# Patient Record
Sex: Female | Born: 1960 | Race: White | Hispanic: No | Smoking: Never smoker
Health system: Southern US, Community
[De-identification: ages and names within clinical notes are randomized; demographics above are authoritative.]

## PROBLEM LIST (undated history)

## (undated) DIAGNOSIS — N870 Mild cervical dysplasia: Secondary | ICD-10-CM

## (undated) DIAGNOSIS — B009 Herpesviral infection, unspecified: Secondary | ICD-10-CM

## (undated) DIAGNOSIS — E28319 Asymptomatic premature menopause: Secondary | ICD-10-CM

## (undated) HISTORY — DX: Asymptomatic premature menopause: E28.319

## (undated) HISTORY — PX: TUBAL LIGATION: SHX77

## (undated) HISTORY — DX: Herpesviral infection, unspecified: B00.9

## (undated) HISTORY — DX: Mild cervical dysplasia: N87.0

---
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
## 1992-04-24 HISTORY — PX: LAPAROSCOPY: SHX197

## 2000-04-24 DIAGNOSIS — N870 Mild cervical dysplasia: Secondary | ICD-10-CM

## 2000-04-24 HISTORY — DX: Mild cervical dysplasia: N87.0

## 2000-04-24 HISTORY — PX: CERVICAL BIOPSY  W/ LOOP ELECTRODE EXCISION: SUR135

## 2000-05-10 ENCOUNTER — Other Ambulatory Visit: Admission: RE | Admit: 2000-05-10 | Discharge: 2000-05-10 | Payer: Self-pay | Admitting: Gynecology

## 2000-05-29 ENCOUNTER — Encounter (INDEPENDENT_AMBULATORY_CARE_PROVIDER_SITE_OTHER): Payer: Self-pay | Admitting: Specialist

## 2000-05-29 ENCOUNTER — Other Ambulatory Visit: Admission: RE | Admit: 2000-05-29 | Discharge: 2000-05-29 | Payer: Self-pay | Admitting: Gynecology

## 2000-10-01 ENCOUNTER — Other Ambulatory Visit: Admission: RE | Admit: 2000-10-01 | Discharge: 2000-10-01 | Payer: Self-pay | Admitting: Gynecology

## 2002-01-07 ENCOUNTER — Other Ambulatory Visit: Admission: RE | Admit: 2002-01-07 | Discharge: 2002-01-07 | Payer: Self-pay | Admitting: Gynecology

## 2003-01-21 ENCOUNTER — Other Ambulatory Visit: Admission: RE | Admit: 2003-01-21 | Discharge: 2003-01-21 | Payer: Self-pay | Admitting: Gynecology

## 2004-01-27 ENCOUNTER — Other Ambulatory Visit: Admission: RE | Admit: 2004-01-27 | Discharge: 2004-01-27 | Payer: Self-pay | Admitting: Gynecology

## 2006-04-20 ENCOUNTER — Other Ambulatory Visit: Admission: RE | Admit: 2006-04-20 | Discharge: 2006-04-20 | Payer: Self-pay | Admitting: Gynecology

## 2006-09-06 ENCOUNTER — Other Ambulatory Visit: Admission: RE | Admit: 2006-09-06 | Discharge: 2006-09-06 | Payer: Self-pay | Admitting: Gynecology

## 2007-11-07 ENCOUNTER — Other Ambulatory Visit: Admission: RE | Admit: 2007-11-07 | Discharge: 2007-11-07 | Payer: Self-pay | Admitting: Gynecology

## 2008-11-12 ENCOUNTER — Ambulatory Visit: Payer: Self-pay | Admitting: Family Medicine

## 2008-11-12 DIAGNOSIS — R5381 Other malaise: Secondary | ICD-10-CM

## 2008-11-12 DIAGNOSIS — R5383 Other fatigue: Secondary | ICD-10-CM

## 2008-11-12 DIAGNOSIS — R109 Unspecified abdominal pain: Secondary | ICD-10-CM | POA: Insufficient documentation

## 2008-11-12 DIAGNOSIS — R197 Diarrhea, unspecified: Secondary | ICD-10-CM

## 2008-11-13 LAB — CONVERTED CEMR LAB
ALT: 16 units/L (ref 0–35)
AST: 21 units/L (ref 0–37)
Albumin: 4.3 g/dL (ref 3.5–5.2)
Basophils Absolute: 0 10*3/uL (ref 0.0–0.1)
Calcium: 9.1 mg/dL (ref 8.4–10.5)
GFR calc non Af Amer: 95.74 mL/min (ref >60)
HCT: 38 % (ref 36.0–46.0)
Hemoglobin: 13.1 g/dL (ref 12.0–15.0)
Lymphs Abs: 1 10*3/uL (ref 0.7–4.0)
MCV: 92.5 fL (ref 78.0–100.0)
Monocytes Absolute: 0.4 10*3/uL (ref 0.1–1.0)
Monocytes Relative: 10.3 % (ref 3.0–12.0)
Neutro Abs: 1.9 10*3/uL (ref 1.4–7.7)
Platelets: 215 10*3/uL (ref 150.0–400.0)
Potassium: 4.1 meq/L (ref 3.5–5.1)
RDW: 12.1 % (ref 11.5–14.6)
Sodium: 145 meq/L (ref 135–145)
TSH: 1.93 microintl units/mL (ref 0.35–5.50)
Total Bilirubin: 0.8 mg/dL (ref 0.3–1.2)

## 2008-12-07 ENCOUNTER — Ambulatory Visit: Payer: Self-pay | Admitting: Family Medicine

## 2008-12-08 LAB — CONVERTED CEMR LAB
Basophils Relative: 0.1 % (ref 0.0–3.0)
Eosinophils Relative: 2.3 % (ref 0.0–5.0)
Hemoglobin: 13 g/dL (ref 12.0–15.0)
Lymphocytes Relative: 36.1 % (ref 12.0–46.0)
Monocytes Relative: 10.9 % (ref 3.0–12.0)
Neutro Abs: 1.6 10*3/uL (ref 1.4–7.7)
Neutrophils Relative %: 50.6 % (ref 43.0–77.0)
RBC: 4.1 M/uL (ref 3.87–5.11)
WBC: 3.2 10*3/uL — ABNORMAL LOW (ref 4.5–10.5)

## 2008-12-11 ENCOUNTER — Other Ambulatory Visit: Admission: RE | Admit: 2008-12-11 | Discharge: 2008-12-11 | Payer: Self-pay | Admitting: Gynecology

## 2008-12-11 ENCOUNTER — Ambulatory Visit (HOSPITAL_COMMUNITY): Admission: RE | Admit: 2008-12-11 | Discharge: 2008-12-11 | Payer: Self-pay | Admitting: Gynecology

## 2008-12-11 ENCOUNTER — Ambulatory Visit: Payer: Self-pay | Admitting: Gynecology

## 2008-12-11 ENCOUNTER — Encounter: Payer: Self-pay | Admitting: Gynecology

## 2008-12-29 ENCOUNTER — Encounter: Payer: Self-pay | Admitting: Gynecology

## 2008-12-29 ENCOUNTER — Ambulatory Visit: Payer: Self-pay | Admitting: Gynecology

## 2009-01-12 ENCOUNTER — Ambulatory Visit: Payer: Self-pay | Admitting: Gynecology

## 2009-04-29 ENCOUNTER — Ambulatory Visit: Payer: Self-pay | Admitting: Gynecology

## 2009-07-13 ENCOUNTER — Other Ambulatory Visit: Admission: RE | Admit: 2009-07-13 | Discharge: 2009-07-13 | Payer: Self-pay | Admitting: Gynecology

## 2009-07-13 ENCOUNTER — Ambulatory Visit: Payer: Self-pay | Admitting: Gynecology

## 2009-11-23 IMAGING — CR DG CHEST 2V
2 series · 2 of 2 positions shown · non-contrast
Comparison: None

CLINICAL DATA: Family history of lung cancer.  No current chest
complaints.  Nonsmoker

CHEST - 2 VIEW

[view not recorded (1 of 2)]
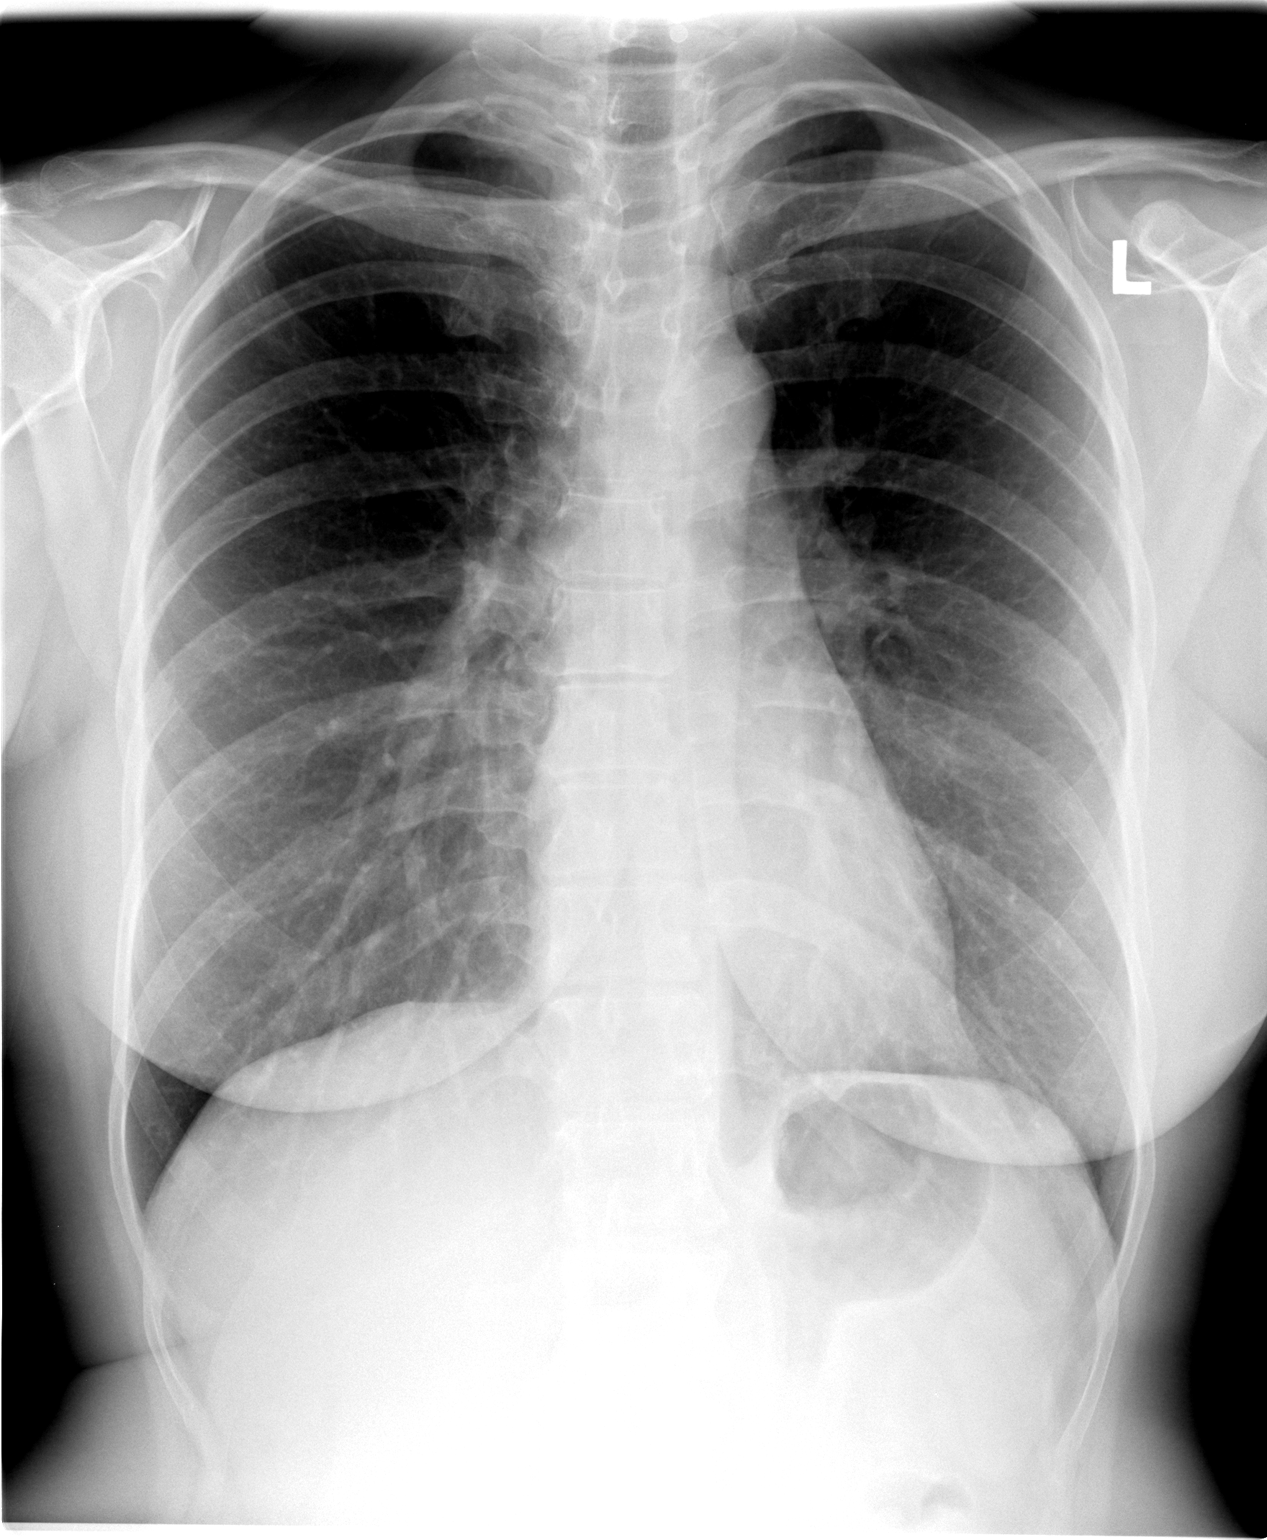

[view not recorded (2 of 2)]
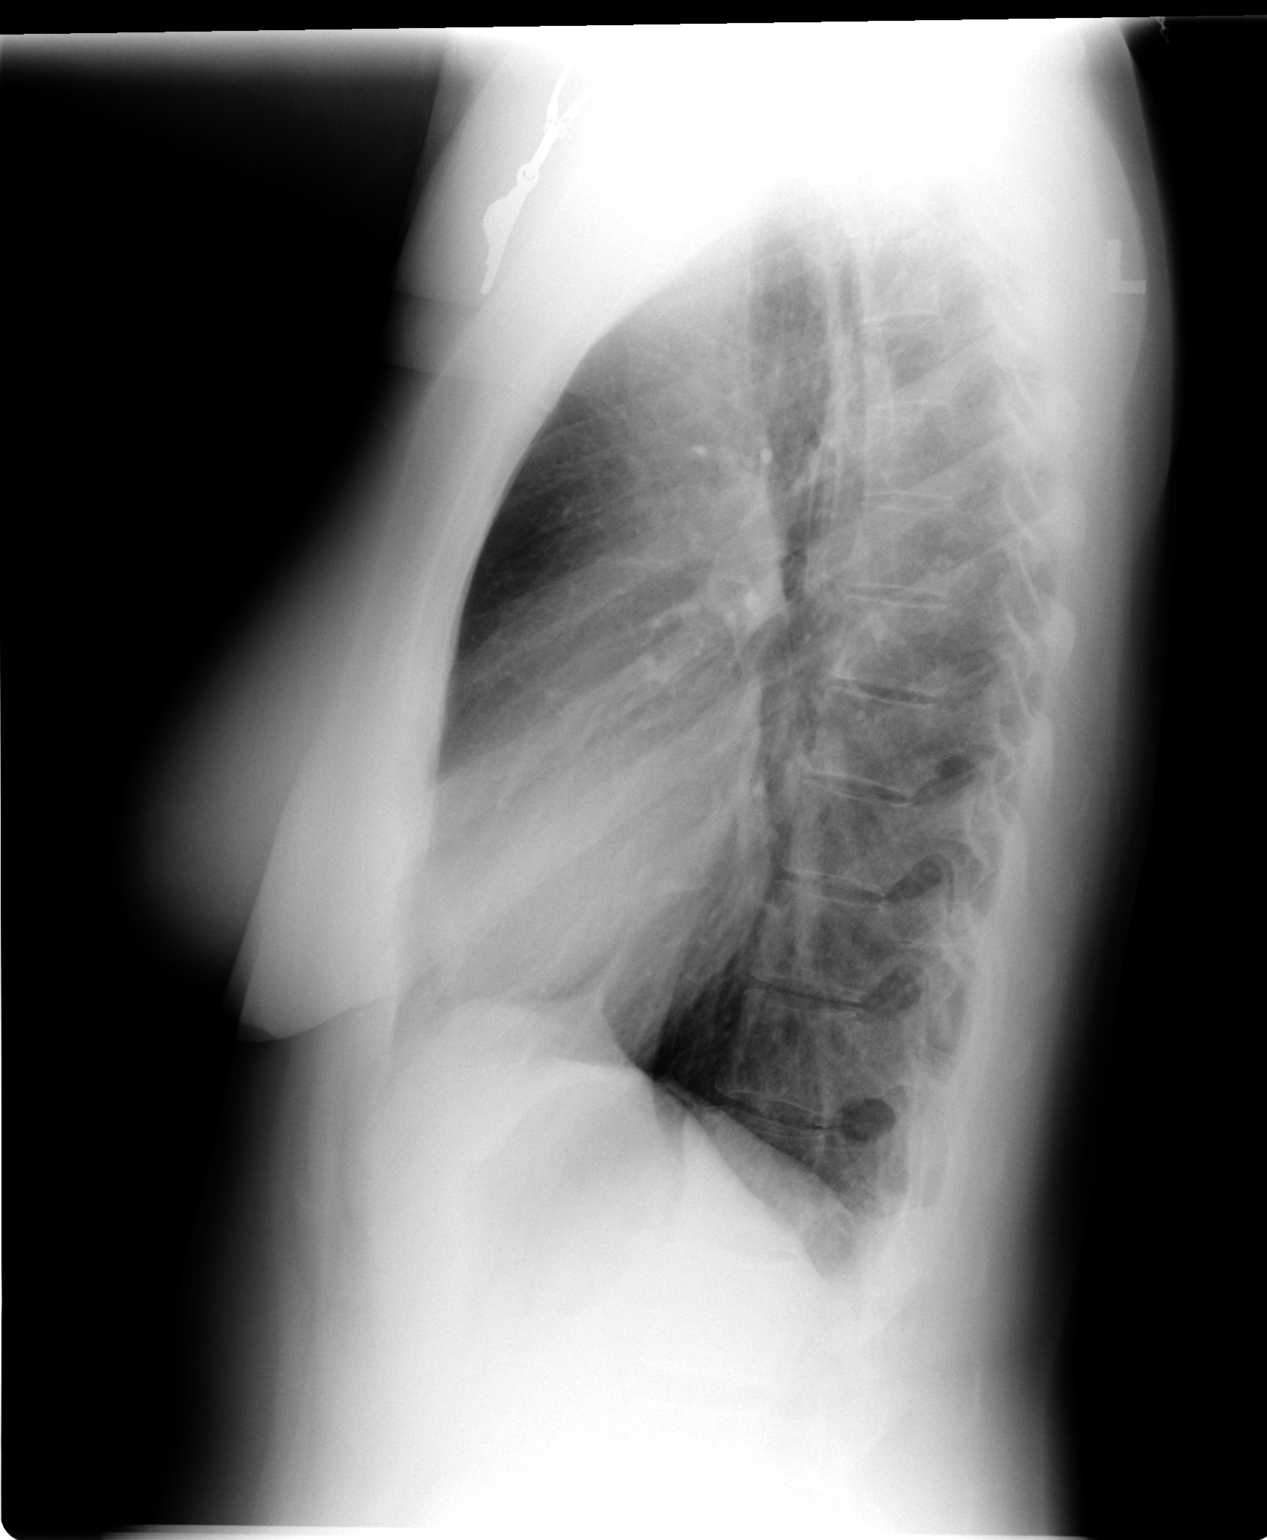

[2 of 2 positions shown; findings below may reference images not displayed]

FINDINGS: Heart and mediastinal contours are within normal limits.
The lung fields are clear with no evidence for focal infiltrate or
congestive failure.  Bony structures appear intact.
IMPRESSION: No acute cardiopulmonary disease noted

## 2009-12-14 ENCOUNTER — Other Ambulatory Visit: Admission: RE | Admit: 2009-12-14 | Discharge: 2009-12-14 | Payer: Self-pay | Admitting: Gynecology

## 2009-12-14 ENCOUNTER — Ambulatory Visit: Payer: Self-pay | Admitting: Gynecology

## 2010-04-25 ENCOUNTER — Ambulatory Visit: Payer: Self-pay | Admitting: Gynecology

## 2010-05-19 ENCOUNTER — Other Ambulatory Visit
Admission: RE | Admit: 2010-05-19 | Discharge: 2010-05-19 | Payer: Self-pay | Source: Home / Self Care | Admitting: Gynecology

## 2010-05-19 ENCOUNTER — Other Ambulatory Visit: Payer: Self-pay | Admitting: Gynecology

## 2011-01-10 DIAGNOSIS — N870 Mild cervical dysplasia: Secondary | ICD-10-CM | POA: Insufficient documentation

## 2011-01-10 DIAGNOSIS — B009 Herpesviral infection, unspecified: Secondary | ICD-10-CM | POA: Insufficient documentation

## 2011-01-10 DIAGNOSIS — E559 Vitamin D deficiency, unspecified: Secondary | ICD-10-CM | POA: Insufficient documentation

## 2011-01-10 DIAGNOSIS — E28319 Asymptomatic premature menopause: Secondary | ICD-10-CM | POA: Insufficient documentation

## 2011-01-11 ENCOUNTER — Encounter: Payer: Self-pay | Admitting: Gynecology

## 2011-01-19 ENCOUNTER — Other Ambulatory Visit: Payer: Self-pay | Admitting: Gynecology

## 2011-01-19 ENCOUNTER — Ambulatory Visit: Admission: RE | Admit: 2011-01-19 | Payer: BC Managed Care – PPO | Source: Ambulatory Visit

## 2011-01-19 DIAGNOSIS — Z13820 Encounter for screening for osteoporosis: Secondary | ICD-10-CM

## 2011-02-01 ENCOUNTER — Other Ambulatory Visit (HOSPITAL_COMMUNITY)
Admission: RE | Admit: 2011-02-01 | Discharge: 2011-02-01 | Disposition: A | Discharge status: Home / Self Care | Payer: BC Managed Care – PPO | Source: Ambulatory Visit | Attending: Gynecology | Admitting: Gynecology

## 2011-02-01 ENCOUNTER — Ambulatory Visit (INDEPENDENT_AMBULATORY_CARE_PROVIDER_SITE_OTHER): Payer: BC Managed Care – PPO | Admitting: Gynecology

## 2011-02-01 ENCOUNTER — Encounter: Payer: Self-pay | Admitting: Gynecology

## 2011-02-01 VITALS — BP 120/80 | Ht 68.75 in | Wt 185.0 lb

## 2011-02-01 DIAGNOSIS — N951 Menopausal and female climacteric states: Secondary | ICD-10-CM

## 2011-02-01 DIAGNOSIS — B009 Herpesviral infection, unspecified: Secondary | ICD-10-CM

## 2011-02-01 DIAGNOSIS — E559 Vitamin D deficiency, unspecified: Secondary | ICD-10-CM

## 2011-02-01 DIAGNOSIS — Z01419 Encounter for gynecological examination (general) (routine) without abnormal findings: Secondary | ICD-10-CM | POA: Insufficient documentation

## 2011-02-01 MED ORDER — ACYCLOVIR 400 MG PO TABS: 400.0000 mg | ORAL_TABLET | ORAL | Status: DC

## 2011-02-01 MED ORDER — MEDROXYPROGESTERONE ACETATE 10 MG PO TABS: 10.0000 mg | ORAL_TABLET | Freq: Every day | ORAL | Status: DC

## 2011-02-01 MED ORDER — ESTRADIOL 0.05 MG/24HR TD PTTW: 1.0000 | MEDICATED_PATCH | TRANSDERMAL | Status: DC

## 2011-02-01 NOTE — Progress Notes (Signed)
Addended by: Cammie Mcgee T on: 02/01/2011 04:52 PM   Modules accepted: Orders

## 2011-02-01 NOTE — Progress Notes (Signed)
Regina Collins 09-Mar-1961 [id number]   History:    50 y.o.  for annual exam with no complaints today. 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 For menopausal symptoms she has been on Vivelle-Dot 0.05 transdermally twice a week with the addition of Provera 10 mg for 10 days of the month. Patient doesn't monthly self breast examination last mammogram was in September of this year which was normal and she had a normal bone density study September 2012.  Past medical history,surgical history, family history and social history were all reviewed and documented in the EPIC chart.  ROS:  Was performed and pertinent positives and negatives are included in the history.  Exam: chaperone present BP 120/80  Ht 5' 8.75" (1.746 m)  Wt 185 lb (83.915 kg)  BMI 27.52 kg/m2  LMP 01/25/2011  Body mass index is 27.52 kg/(m^2).  General appearance : Well developed well nourished female. No acute distress HEENT: Neck supple, trachea midline, no carotid bruits, no thyroidmegaly Lungs: Clear to auscultation, no rhonchi or wheezes, or rib retractions  Heart: Regular rate and rhythm, no murmurs or gallops Breast:Examined in sitting and supine position were symmetrical in appearance, no palpable masses or tenderness,  no skin retraction, no nipple inversion, no nipple discharge, no skin discoloration, no axillary or supraclavicular lymphadenopathy Abdomen: no palpable masses or tenderness, no rebound or guarding Extremities: no edema or skin discoloration or tenderness  Pelvic:  Bartholin, Urethra, Skene Glands: Within normal limits             Vagina: No gross lesions or discharge  Cervix: No gross lesions or discharge  Uterus  anteverted, normal size, shape and consistency, non-tender and mobile  Adnexa  Without masses or tenderness  Anus and  perineum  normal   Rectovaginal  normal sphincter tone without palpated masses or tenderness             Hemoccult not done     Assessment/Plan:  50 y.o. female for annual exam unremarkable. Pap smear done today as well as vitamin D level. Once again we discussed women's health initiative study as a topical hormone replacement therapy. Patient fully understands and accepts. Subcuticular calcium vitamin D and continued to exercise a regular basis for osteoporosis prevention as well. We'll otherwise see her back in one year or when necessary.    Ok Edwards MD, 4:11 PM 02/01/2011

## 2011-02-01 NOTE — Patient Instructions (Signed)
Make sure to send [place] your labs in January

## 2011-02-02 ENCOUNTER — Telehealth: Payer: Self-pay | Admitting: *Deleted

## 2011-02-02 DIAGNOSIS — B009 Herpesviral infection, unspecified: Secondary | ICD-10-CM

## 2011-02-02 MED ORDER — ACYCLOVIR 400 MG PO TABS: ORAL_TABLET | ORAL | Status: DC

## 2011-02-02 NOTE — Telephone Encounter (Signed)
Pharmacy asked to please clarify the direction for pt acyclovir 400 mg. Pt will need a # 90 day supply. Rx is wrote for 1 day dose rather than daily. Spoke with pt and she takes medication daily. Please advise the above

## 2011-02-02 NOTE — Telephone Encounter (Signed)
Correction of acyclovir 400 mg sent to pharmacy.

## 2011-02-02 NOTE — Telephone Encounter (Signed)
Correction: Patient to take the acyclovir 400 mg one tablet daily for suppressive therapy. She'll be prescribed 90 at one time with 4 refills.

## 2011-02-17 ENCOUNTER — Other Ambulatory Visit (INDEPENDENT_AMBULATORY_CARE_PROVIDER_SITE_OTHER): Payer: BC Managed Care – PPO | Admitting: Gynecology

## 2011-02-17 DIAGNOSIS — E559 Vitamin D deficiency, unspecified: Secondary | ICD-10-CM

## 2012-02-03 ENCOUNTER — Other Ambulatory Visit: Payer: Self-pay | Admitting: Gynecology

## 2012-02-06 ENCOUNTER — Encounter: Payer: Self-pay | Admitting: Gynecology

## 2012-02-20 ENCOUNTER — Other Ambulatory Visit (HOSPITAL_COMMUNITY)
Admission: RE | Admit: 2012-02-20 | Discharge: 2012-02-20 | Disposition: A | Discharge status: Home / Self Care | Payer: BC Managed Care – PPO | Source: Ambulatory Visit | Attending: Gynecology | Admitting: Gynecology

## 2012-02-20 ENCOUNTER — Ambulatory Visit (INDEPENDENT_AMBULATORY_CARE_PROVIDER_SITE_OTHER): Payer: BC Managed Care – PPO | Admitting: Gynecology

## 2012-02-20 ENCOUNTER — Encounter: Payer: Self-pay | Admitting: Gynecology

## 2012-02-20 VITALS — BP 124/78 | Ht 69.0 in | Wt 172.0 lb

## 2012-02-20 DIAGNOSIS — Z01419 Encounter for gynecological examination (general) (routine) without abnormal findings: Secondary | ICD-10-CM

## 2012-02-20 DIAGNOSIS — Z1151 Encounter for screening for human papillomavirus (HPV): Secondary | ICD-10-CM | POA: Insufficient documentation

## 2012-02-20 DIAGNOSIS — Z23 Encounter for immunization: Secondary | ICD-10-CM

## 2012-02-20 DIAGNOSIS — N951 Menopausal and female climacteric states: Secondary | ICD-10-CM

## 2012-02-20 DIAGNOSIS — Z79890 Hormone replacement therapy (postmenopausal): Secondary | ICD-10-CM

## 2012-02-20 MED ORDER — ACYCLOVIR 400 MG PO TABS: 400.0000 mg | ORAL_TABLET | Freq: Every day | ORAL | Status: DC

## 2012-02-20 MED ORDER — MEDROXYPROGESTERONE ACETATE 10 MG PO TABS: 10.0000 mg | ORAL_TABLET | Freq: Every day | ORAL | Status: DC

## 2012-02-20 MED ORDER — ESTRADIOL 1 MG PO TABS: 1.0000 mg | ORAL_TABLET | Freq: Every day | ORAL | Status: DC

## 2012-02-20 NOTE — Patient Instructions (Signed)

## 2012-02-20 NOTE — Progress Notes (Signed)
Regina Collins 04/16/1961 [id number]   History:    51 y.o.  for annual gyn exam with no complaints today. She states that her Roselind Rily is too expensive. She has history of premature menopause and had been on Vivelle-Dot 0.05 mg which she was applying twice a week with the addition of Provera 10 mg for 10 days of the month. Patient in the past has had history vitamin D deficiency. She had a normal bone density study in 2012. Her mammogram was normal in October 2013. She does her monthly self breast examination. 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 Patient with history of recurrent HSV is on suppressive therapy with acyclovir 400 mg daily. She has been exercising and eating healthier and has lost 13 pounds since last year. She does not recall ever having received the Tdap vaccine.  Past medical history,surgical history, family history and social history were all reviewed and documented in the EPIC chart.  Gynecologic History Patient's last menstrual period was 02/12/2012. Contraception: none and Menopause Last Pap: 2012. Results were: normal Last mammogram: 2013. Results were: normal  Obstetric History OB History    Grav Para Term Preterm Abortions TAB SAB Ect Mult Living   2 2        2      # Outc Date GA Lbr Len/2nd Wgt Sex Del Anes PTL Lv   1 PAR     F SVD      2 PAR     M CS          ROS: A ROS was performed and pertinent positives and negatives are included in the history.  GENERAL: No fevers or chills. HEENT: No change in vision, no earache, sore throat or sinus congestion. NECK: No pain or stiffness. CARDIOVASCULAR: No chest pain or pressure. No palpitations. PULMONARY: No shortness of breath, cough or wheeze. GASTROINTESTINAL: No abdominal pain, nausea, vomiting or diarrhea, melena or bright red blood per rectum. GENITOURINARY: No urinary frequency, urgency, hesitancy or dysuria.  MUSCULOSKELETAL: No joint or muscle pain, no back pain, no recent trauma. DERMATOLOGIC: No rash, no itching, no lesions. ENDOCRINE: No polyuria, polydipsia, no heat or cold intolerance. No recent change in weight. HEMATOLOGICAL: No anemia or easy bruising or bleeding. NEUROLOGIC: No headache, seizures, numbness, tingling or weakness. PSYCHIATRIC: No depression, no loss of interest in normal activity or change in sleep pattern.     Exam: chaperone present  BP 124/78  Ht 5\' 9"  (1.753 m)  Wt 172 lb (78.019 kg)  BMI 25.40 kg/m2  LMP 02/12/2012  Body mass index is 25.40 kg/(m^2).  General appearance : Well developed well nourished female. No acute distress HEENT: Neck supple, trachea midline, no carotid bruits, no thyroidmegaly Lungs: Clear to auscultation, no rhonchi or wheezes, or rib retractions  Heart: Regular rate and rhythm, no murmurs or gallops Breast:Examined in sitting and supine position were symmetrical in appearance, no palpable masses or tenderness,  no skin retraction, no nipple inversion, no nipple discharge, no skin discoloration, no axillary or supraclavicular lymphadenopathy Abdomen: no palpable masses or tenderness, no rebound or guarding Extremities: no edema or skin discoloration or tenderness  Pelvic:  Bartholin, Urethra, Skene Glands: Within normal limits             Vagina: No gross lesions or discharge  Cervix: No gross lesions or discharge  Uterus  anteverted, normal size, shape and consistency, non-tender and mobile  Adnexa  Without masses or tenderness  Anus and perineum  normal   Rectovaginal  normal sphincter tone without palpated masses or tenderness             Hemoccult not done     Assessment/Plan:  51 y.o. female for annual exam will have the following labs drawn: CBC, TSH, fasting blood sugar, fasting lipid profile and urinalysis as well as vitamin D will be drawn at her work and she will fax is the result. Prescription refill for acyclovir 400 mg to  take 1 by mouth daily was provided. We are going to switch her from the Vivelle-Dot to estradiol 1 mg by mouth daily with the addition of Provera 10 mg for 10 days of the month. We discussed once again the women's health initiative study and potential risk of breast cancer. We'll begin to start tapering her off in the next few years. She will receive the Tdap vaccine today as well. We did discuss the new Pap smear screening guidelines. Patch and will be drawn today and then we'll follow the guidelines every 3 years. She was reminded to take her calcium vitamin D and to continue with her exercise regularly and to do her monthly self breast examination. Next year she will need a bone density study and colonoscopy.    Ok Edwards MD, 12:53 PM 02/20/2012

## 2012-02-21 ENCOUNTER — Encounter: Payer: Self-pay | Admitting: Gynecology

## 2012-02-26 ENCOUNTER — Encounter: Payer: Self-pay | Admitting: Gynecology

## 2012-03-26 ENCOUNTER — Telehealth: Payer: Self-pay | Admitting: *Deleted

## 2012-03-26 NOTE — Telephone Encounter (Signed)
error 

## 2012-04-23 ENCOUNTER — Encounter: Payer: Self-pay | Admitting: Gynecology

## 2012-04-23 ENCOUNTER — Ambulatory Visit (INDEPENDENT_AMBULATORY_CARE_PROVIDER_SITE_OTHER): Payer: BC Managed Care – PPO | Admitting: Gynecology

## 2012-04-23 DIAGNOSIS — R1030 Lower abdominal pain, unspecified: Secondary | ICD-10-CM

## 2012-04-23 DIAGNOSIS — R109 Unspecified abdominal pain: Secondary | ICD-10-CM

## 2012-04-23 LAB — URINALYSIS W MICROSCOPIC + REFLEX CULTURE
Bilirubin Urine: NEGATIVE
Glucose, UA: NEGATIVE mg/dL
Specific Gravity, Urine: 1.025 (ref 1.005–1.030)
Urobilinogen, UA: 2 mg/dL — ABNORMAL HIGH (ref 0.0–1.0)
pH: 5.5 (ref 5.0–8.0)

## 2012-04-23 LAB — CBC WITH DIFFERENTIAL/PLATELET
Eosinophils Relative: 2 % (ref 0–5)
HCT: 38.1 % (ref 36.0–46.0)
Hemoglobin: 13.2 g/dL (ref 12.0–15.0)
Lymphocytes Relative: 19 % (ref 12–46)
Lymphs Abs: 1.1 10*3/uL (ref 0.7–4.0)
MCV: 87.6 fL (ref 78.0–100.0)
Monocytes Relative: 13 % — ABNORMAL HIGH (ref 3–12)
Platelets: 260 10*3/uL (ref 150–400)
RBC: 4.35 MIL/uL (ref 3.87–5.11)
WBC: 5.8 10*3/uL (ref 4.0–10.5)

## 2012-04-23 MED ORDER — IBUPROFEN 800 MG PO TABS: 800.0000 mg | ORAL_TABLET | Freq: Three times a day (TID) | ORAL | Status: AC | PRN

## 2012-04-23 NOTE — Patient Instructions (Signed)
Follow up for ultrasound. 

## 2012-04-23 NOTE — Progress Notes (Signed)
Patient presents with a 2 day history of left lower quadrant pain.  No history of this before.  Started nagging and then acutely became sharp stabbing. Took OTC Advil and now seems to be better with a low aching. She is on HRT doing intermittent progesterone withdrawals. No nausea vomiting diarrhea constipation or urinary symptoms. No fever chills. No blood in the urine.  Exam with block assistant Spine straight no CVA tenderness Abdomen soft nontender without masses guarding rebound organomegaly. Pelvic external BUS vagina normal. Cervix normal. Uterus normal size midline mobile nontender. Adnexa without masses or tenderness. Rectovaginal exam is normal.  Assessment and plan: Left lower quadrant pain some radiation across the mid abdomen. No localizing symptoms such as GI or GU. Postmenopausal unlikely ovarian. Nonacute to suggest torsion or other more significant causes.  Start with ultrasound rule out nonpalpable abnormalities.  Check baseline CBC for white count. Motrin 800 mg #31 refill for pain. If pain resolves and ultrasound normal that we'll follow. If pain persists an ultrasound normal consider GI referral.

## 2012-06-08 ENCOUNTER — Other Ambulatory Visit: Payer: Self-pay

## 2012-08-01 ENCOUNTER — Other Ambulatory Visit: Payer: Self-pay | Admitting: Gynecology

## 2013-02-14 ENCOUNTER — Telehealth: Payer: Self-pay | Admitting: *Deleted

## 2013-02-14 ENCOUNTER — Encounter: Payer: Self-pay | Admitting: Internal Medicine

## 2013-02-14 NOTE — Telephone Encounter (Signed)
Pt called requesting name of a GI Md to schedule colonoscopy # to [place] given to pt to call and set up.

## 2013-02-27 ENCOUNTER — Other Ambulatory Visit: Payer: Self-pay

## 2013-02-27 ENCOUNTER — Encounter: Payer: Self-pay | Admitting: Gynecology

## 2013-03-13 ENCOUNTER — Ambulatory Visit (INDEPENDENT_AMBULATORY_CARE_PROVIDER_SITE_OTHER): Payer: BC Managed Care – PPO | Admitting: Gynecology

## 2013-03-13 ENCOUNTER — Other Ambulatory Visit (HOSPITAL_COMMUNITY)
Admission: RE | Admit: 2013-03-13 | Discharge: 2013-03-13 | Disposition: A | Discharge status: Home / Self Care | Payer: BC Managed Care – PPO | Source: Ambulatory Visit | Attending: Gynecology | Admitting: Gynecology

## 2013-03-13 ENCOUNTER — Encounter: Payer: Self-pay | Admitting: Gynecology

## 2013-03-13 VITALS — BP 128/82 | Ht 69.0 in | Wt 176.0 lb

## 2013-03-13 DIAGNOSIS — Z01419 Encounter for gynecological examination (general) (routine) without abnormal findings: Secondary | ICD-10-CM | POA: Insufficient documentation

## 2013-03-13 DIAGNOSIS — N951 Menopausal and female climacteric states: Secondary | ICD-10-CM

## 2013-03-13 DIAGNOSIS — Z8639 Personal history of other endocrine, nutritional and metabolic disease: Secondary | ICD-10-CM

## 2013-03-13 DIAGNOSIS — Z780 Asymptomatic menopausal state: Secondary | ICD-10-CM

## 2013-03-13 MED ORDER — ACYCLOVIR 400 MG PO TABS: 400.0000 mg | ORAL_TABLET | Freq: Every day | ORAL | Status: DC

## 2013-03-13 NOTE — Progress Notes (Signed)
Regina Collins [birth year] [id number]   History:    52 y.o.  for annual gyn exam with no complaints today. Review of her record indicates that she had been in the past on Vivelle-Dot 0.05 mg which she was applying transdermally twice a week with the addition of Provera 10 mg for 10 days of the month. Patient discontinued the HRT in October and is having minimal if any vasomotor symptoms for now. Patient had a few blood work done at her work which consisted of lipid profile and blood sugar which were normal. Patient had a normal bone density study in 2012. Her mammogram was normal this year. Patient in 2002 had LEEP cervical conization of her cervix pathology report indicated only CIN-1 and subsequent Pap smears have been normal. Patient received the Tdap vaccine last year and not interested in flu vaccine. Patient has had history of vitamin D deficiency in the past.  Past medical history,surgical history, family history and social history were all reviewed and documented in the EPIC chart.  Gynecologic History No LMP recorded. Patient is not currently having periods (Reason: Other). Contraception: post menopausal status Last Pap: 2013. Results were: normal Last mammogram: 2014. Results were: normal  Obstetric History OB History  Gravida Para Term Preterm AB SAB TAB Ectopic Multiple Living  2 2        2     # Outcome Date GA Lbr Len/2nd Weight Sex Delivery Anes PTL Lv  2 PAR     M CS     1 PAR     F SVD          ROS: A ROS was performed and pertinent positives and negatives are included in the history.  GENERAL: No fevers or chills. HEENT: No change in vision, no earache, sore throat or sinus congestion. NECK: No pain or stiffness. CARDIOVASCULAR: No chest pain or pressure. No palpitations. PULMONARY: No shortness of breath, cough or wheeze. GASTROINTESTINAL: No abdominal pain, nausea, vomiting or diarrhea, melena or bright red blood per rectum. GENITOURINARY: No urinary frequency,  urgency, hesitancy or dysuria. MUSCULOSKELETAL: No joint or muscle pain, no back pain, no recent trauma. DERMATOLOGIC: No rash, no itching, no lesions. ENDOCRINE: No polyuria, polydipsia, no heat or cold intolerance. No recent change in weight. HEMATOLOGICAL: No anemia or easy bruising or bleeding. NEUROLOGIC: No headache, seizures, numbness, tingling or weakness. PSYCHIATRIC: No depression, no loss of interest in normal activity or change in sleep pattern. Patient this year was treated for suspected diverticulosis and now modifying her diet.. Patient currently takes acyclovir 400 mg daily for HSV suppression.    Exam: chaperone present  BP 128/82  Ht 5\' 9"  (1.753 m)  Wt 176 lb (79.833 kg)  BMI 25.98 kg/m2  Body mass index is 25.98 kg/(m^2).  General appearance : Well developed well nourished female. No acute distress HEENT: Neck supple, trachea midline, no carotid bruits, no thyroidmegaly Lungs: Clear to auscultation, no rhonchi or wheezes, or rib retractions  Heart: Regular rate and rhythm, no murmurs or gallops Breast:Examined in sitting and supine position were symmetrical in appearance, no palpable masses or tenderness,  no skin retraction, no nipple inversion, no nipple discharge, no skin discoloration, no axillary or supraclavicular lymphadenopathy Abdomen: no palpable masses or tenderness, no rebound or guarding Extremities: no edema or skin discoloration or tenderness  Pelvic:  Bartholin, Urethra, Skene Glands: Within normal limits             Vagina: No gross lesions or discharge  Cervix: No gross lesions or discharge  Uterus  anteverted, normal size, shape and consistency, non-tender and mobile  Adnexa  Without masses or tenderness  Anus and perineum  normal   Rectovaginal  normal sphincter tone without palpated masses or tenderness             Hemoccult to schedule colonoscopy     Assessment/Plan:  52 y.o. female for annual exam who is no longer on HRT. Patient was given  literature information for future reference on Brisdelle and Osphena. She will schedule a bone density study here in the office in the next few weeks. She will also schedule for colonoscopy. We discussed importance of calcium and vitamin D and regular exercise for osteoporosis prevention. Pap smear was done today. The following labs were ordered: CBC, TSH and vitamin D since she has had history vitamin D deficiency in the past.  Note: This dictation was prepared with  Dragon/digital dictation along withSmart phrase technology. Any transcriptional errors that result from this process are unintentional.   Ok Edwards MD, 9:35 AM 03/13/2013

## 2013-03-13 NOTE — Patient Instructions (Signed)
Bone Densitometry Bone densitometry is a special X-ray that measures your bone density and can be used to help predict your risk of bone fractures. This test is used to determine bone mineral content and density to diagnose osteoporosis. Osteoporosis is the loss of bone that may cause the bone to become weak. Osteoporosis commonly occurs in women entering menopause. However, it may be found in men and in people with other diseases. PREPARATION FOR TEST No preparation necessary. WHO SHOULD BE TESTED?  All women older than 65.  Postmenopausal women (50 to 65) with risk factors for osteoporosis.  People with a previous fracture caused by normal activities.  People with a small body frame (less than 127 poundsor a body mass index [BMI] of less than 21).  People who have a parent with a hip fracture or history of osteoporosis.  People who smoke.  People who have rheumatoid arthritis.  Anyone who engages in excessive alcohol use (more than 3 drinks most days).  Women who experience early menopause. WHEN SHOULD YOU BE RETESTED? Current guidelines suggest that you should wait at least 2 years before doing a bone density test again if your first test was normal.Recent studies indicated that women with normal bone density may be able to wait a few years before needing to repeat a bone density test. You should discuss this with your caregiver.  NORMAL FINDINGS   Normal: less than standard deviation below normal (greater than -1).  Osteopenia: 1 to 2.5 standard deviations below normal (-1 to -2.5).  Osteoporosis: greater than 2.5 standard deviations below normal (less than -2.5). Test results are reported as a "T score" and a "Z score."The T score is a number that compares your bone density with the bone density of healthy, young women.The Z score is a number that compares your bone density with the scores of women who are the same age, gender, and race.  Ranges for normal findings may vary  among different laboratories and hospitals. You should always check with your doctor after having lab work or other tests done to discuss the meaning of your test results and whether your values are considered within normal limits. MEANING OF TEST  Your caregiver will go over the test results with you and discuss the importance and meaning of your results, as well as treatment options and the need for additional tests if necessary. OBTAINING THE TEST RESULTS It is your responsibility to obtain your test results. Ask the lab or department performing the test when and how you will get your results. Document Released: 05/02/2004 Document Revised: 07/03/2011 Document Reviewed: 05/25/2010 [employer] Patient Information 2014 [employer].  

## 2013-03-14 LAB — URINALYSIS W MICROSCOPIC + REFLEX CULTURE
Bacteria, UA: NONE SEEN
Bilirubin Urine: NEGATIVE
Glucose, UA: NEGATIVE mg/dL
Hgb urine dipstick: NEGATIVE
Leukocytes, UA: NEGATIVE
Protein, ur: NEGATIVE mg/dL
Squamous Epithelial / LPF: NONE SEEN
pH: 5.5 (ref 5.0–8.0)

## 2013-04-22 ENCOUNTER — Encounter: Payer: Self-pay | Admitting: Internal Medicine

## 2013-04-22 ENCOUNTER — Ambulatory Visit (AMBULATORY_SURGERY_CENTER): Payer: Self-pay | Admitting: *Deleted

## 2013-04-22 VITALS — Ht 69.0 in | Wt 181.4 lb

## 2013-04-22 DIAGNOSIS — Z1211 Encounter for screening for malignant neoplasm of colon: Secondary | ICD-10-CM

## 2013-04-22 MED ORDER — MOVIPREP 100 G PO SOLR: ORAL | Status: DC

## 2013-04-22 NOTE — Progress Notes (Signed)
No allergies to eggs or soy. No problems with anesthesia.  

## 2013-04-28 ENCOUNTER — Other Ambulatory Visit: Payer: Self-pay | Admitting: Gynecology

## 2013-04-28 DIAGNOSIS — E559 Vitamin D deficiency, unspecified: Secondary | ICD-10-CM

## 2013-04-28 DIAGNOSIS — Z13820 Encounter for screening for osteoporosis: Secondary | ICD-10-CM

## 2013-05-06 ENCOUNTER — Encounter: Payer: Self-pay | Admitting: Internal Medicine

## 2013-05-06 ENCOUNTER — Ambulatory Visit (AMBULATORY_SURGERY_CENTER): Payer: BC Managed Care – PPO | Admitting: Internal Medicine

## 2013-05-06 VITALS — BP 106/82 | HR 61 | Temp 96.5°F | Resp 17 | Ht 69.0 in | Wt 181.0 lb

## 2013-05-06 DIAGNOSIS — Z1211 Encounter for screening for malignant neoplasm of colon: Secondary | ICD-10-CM

## 2013-05-06 MED ORDER — SODIUM CHLORIDE 0.9 % IV SOLN: 500.0000 mL | INTRAVENOUS | Status: DC

## 2013-05-06 NOTE — Op Note (Signed)
 [facility] [address] [place], [id number]   COLONOSCOPY PROCEDURE REPORT  PATIENT: Regina Collins, Regina E.  MR#: [id number] BIRTHDATE: 10/08/1960 , 52  yrs. old GENDER: Female ENDOSCOPIST: Hart Carwinora M Chassidy Layson, MD REFERRED AO:[id number]:Bruce Burchette, M.D.,Dr J.Fernandez PROCEDURE DATE:  05/06/2013 PROCEDURE:   Colonoscopy, screening First Screening Colonoscopy - Avg.  risk and is 50 yrs.  old or older Yes.  Prior Negative Screening - Now for repeat screening. N/A  History of Adenoma - Now for follow-up colonoscopy & has been > or = to 3 yrs.  N/A  Polyps Removed Today? No.  Recommend repeat exam, <10 yrs? No. ASA CLASS:   Class I INDICATIONS:Average risk patient for colon cancer. MEDICATIONS: MAC sedation, administered by CRNA and Propofol (Diprivan) 220 mg IV  DESCRIPTION OF PROCEDURE:   After the risks benefits and alternatives of the procedure were thoroughly explained, informed consent was obtained.  A digital rectal exam revealed no abnormalities of the rectum.   The LB [id number]-[id number]  endoscope was introduced through the anus and advanced to the cecum, which was identified by both the appendix and ileocecal valve. No adverse events experienced.   The quality of the prep was excellent, using MoviPrep  The instrument was then slowly withdrawn as the colon was fully examined.      COLON FINDINGS: There was moderate diverticulosis noted in the sigmoid colon with associated tortuosity and muscular hypertrophy. Retroflexed views revealed no abnormalities. The time to cecum=8 minutes 36 seconds.  Withdrawal time=6 minutes 10 seconds.  The scope was withdrawn and the procedure completed. COMPLICATIONS: There were no complications.  ENDOSCOPIC IMPRESSION: There was moderate diverticulosis noted in the sigmoid colon  RECOMMENDATIONS: high-fiber diet Fiber supplements of choice Recall colonoscopy in 10 years   eSigned:  Hart Carwinora M Lyndel Dancel, MD 05/06/2013 9:43  AM   cc:   PATIENT NAME:  Regina Collins, Regina E. MR#: [id number]

## 2013-05-06 NOTE — Progress Notes (Signed)
A/ox3 pleased with MAC, report to Marie RN 

## 2013-05-06 NOTE — Progress Notes (Signed)
Patient did not experience any of the following events: a burn prior to discharge; a fall within the facility; wrong site/side/patient/procedure/implant event; or a hospital transfer or hospital admission upon discharge from the facility. (G8907)Patient did not have preoperative order for IV antibiotic SSI prophylaxis. ([id number]) ewm 

## 2013-05-06 NOTE — Patient Instructions (Signed)

## 2013-05-07 ENCOUNTER — Telehealth: Payer: Self-pay | Admitting: *Deleted

## 2013-05-07 NOTE — Telephone Encounter (Signed)
No answer, left message to call if questions or concerns. 

## 2013-05-20 ENCOUNTER — Ambulatory Visit (INDEPENDENT_AMBULATORY_CARE_PROVIDER_SITE_OTHER): Payer: BC Managed Care – PPO

## 2013-05-20 ENCOUNTER — Encounter: Payer: Self-pay | Admitting: Anesthesiology

## 2013-05-20 DIAGNOSIS — E559 Vitamin D deficiency, unspecified: Secondary | ICD-10-CM

## 2013-05-20 DIAGNOSIS — Z13820 Encounter for screening for osteoporosis: Secondary | ICD-10-CM

## 2013-05-22 ENCOUNTER — Other Ambulatory Visit: Payer: Self-pay | Admitting: *Deleted

## 2013-05-22 MED ORDER — ACYCLOVIR 400 MG PO TABS: 400.0000 mg | ORAL_TABLET | Freq: Every day | ORAL | Status: DC

## 2014-02-23 ENCOUNTER — Encounter: Payer: Self-pay | Admitting: Internal Medicine

## 2014-03-18 ENCOUNTER — Ambulatory Visit (INDEPENDENT_AMBULATORY_CARE_PROVIDER_SITE_OTHER): Payer: BC Managed Care – PPO | Admitting: Gynecology

## 2014-03-18 ENCOUNTER — Encounter: Payer: Self-pay | Admitting: Gynecology

## 2014-03-18 VITALS — BP 124/80 | Ht 69.25 in | Wt 181.0 lb

## 2014-03-18 DIAGNOSIS — Z780 Asymptomatic menopausal state: Secondary | ICD-10-CM

## 2014-03-18 DIAGNOSIS — Z8639 Personal history of other endocrine, nutritional and metabolic disease: Secondary | ICD-10-CM

## 2014-03-18 DIAGNOSIS — Z01419 Encounter for gynecological examination (general) (routine) without abnormal findings: Secondary | ICD-10-CM

## 2014-03-18 MED ORDER — ACYCLOVIR 400 MG PO TABS: 400.0000 mg | ORAL_TABLET | Freq: Every day | ORAL | Status: DC

## 2014-03-18 NOTE — Progress Notes (Signed)
Regina MoatsLinda E Collins Nov 28, 1960 [id number]   History:    53 y.o.  for annual gyn exam with no complaints today. Patient stop estrogen replacement therapy almost 2 years ago and is having very minimal if any vasomotor symptoms. She discontinued HRT in October 2014.Patient had a few blood work done at her work which consisted of lipid profile and blood sugar which were normal. She did inform me that she was found to have vitamin D deficiency and she was prescribed 5000 units daily but no follow-up plans. Patient had a normal bone density study in 2012 and 2015 respectively.Patient in 2002 had LEEP cervical conization of her cervix pathology report indicated only CIN-1 and subsequent Pap smears have been normal. Last 3 years consecutively Pap smears been normal. Patient declined flu vaccine. Patient received the TDap vaccine in 2014. Patient had a colonoscopy normal January 2015. Her mammogram is due next month. Patient reports no vaginal bleeding.  medical history,surgical history, family history and social history were all reviewed and documented in the EPIC chart.  Gynecologic History No LMP recorded. Patient is not currently having periods (Reason: Other). Contraception: post menopausal status Last Pap:  2014. Results were: normal Last mammogram:  2014. Results were: normal  Obstetric History OB History  Gravida Para Term Preterm AB SAB TAB Ectopic Multiple Living  2 2        2     # Outcome Date GA Lbr Len/2nd Weight Sex Delivery Anes PTL Lv  2 Para     M CS-Unspec     1 Para     F Vag-Spont          ROS: A ROS was performed and pertinent positives and negatives are included in the history.  GENERAL: No fevers or chills. HEENT: No change in vision, no earache, sore throat or sinus congestion. NECK: No pain or stiffness. CARDIOVASCULAR: No chest pain or pressure. No palpitations. PULMONARY: No shortness of breath, cough or wheeze. GASTROINTESTINAL: No abdominal pain, nausea, vomiting or  diarrhea, melena or bright red blood per rectum. GENITOURINARY: No urinary frequency, urgency, hesitancy or dysuria. MUSCULOSKELETAL: No joint or muscle pain, no back pain, no recent trauma. DERMATOLOGIC: No rash, no itching, no lesions. ENDOCRINE: No polyuria, polydipsia, no heat or cold intolerance. No recent change in weight. HEMATOLOGICAL: No anemia or easy bruising or bleeding. NEUROLOGIC: No headache, seizures, numbness, tingling or weakness. PSYCHIATRIC: No depression, no loss of interest in normal activity or change in sleep pattern.     Exam: chaperone present  BP 124/80 mmHg  Ht 5' 9.25" (1.759 m)  Wt 181 lb (82.101 kg)  BMI 26.53 kg/m2  Body mass index is 26.53 kg/(m^2).  General appearance : Well developed well nourished female. No acute distress HEENT: Neck supple, trachea midline, no carotid bruits, no thyroidmegaly Lungs: Clear to auscultation, no rhonchi or wheezes, or rib retractions  Heart: Regular rate and rhythm, no murmurs or gallops Breast:Examined in sitting and supine position were symmetrical in appearance, no palpable masses or tenderness,  no skin retraction, no nipple inversion, no nipple discharge, no skin discoloration, no axillary or supraclavicular lymphadenopathy Abdomen: no palpable masses or tenderness, no rebound or guarding Extremities: no edema or skin discoloration or tenderness  Pelvic:  Bartholin, Urethra, Skene Glands: Within normal limits             Vagina: No gross lesions or discharge  Cervix: No gross lesions or discharge  Uterus  anteverted, normal size, shape and consistency, non-tender and  mobile  Adnexa  Without masses or tenderness  Anus and perineum  normal   Rectovaginal  normal sphincter tone without palpated masses or tenderness             Hemoccult  normal colonoscopy this year     Assessment/Plan:  53 y.o. female for annual exam  who is menopausal but is having very minimal vasomotor symptoms on no hormone replacement  therapy. Patient had a normal colonoscopy this year. Patient scheduled for mammogram next month. We discussed importance of calcium vitamin D and regular exercise for osteoporosis prevention. She had her lab work done at her work recently and was prescribed vitamin D 5000 units daily I have asked her to continue it for 3 months and then return to the office for vitamin D level blood test. No blood test done today. Pap smear is not done today in accordance to the new guidelines. Patient declined flu vaccine.   Ok EdwardsFERNANDEZ,JUAN H MD, 10:08 AM 03/18/2014

## 2015-01-19 ENCOUNTER — Other Ambulatory Visit: Payer: Self-pay | Admitting: Gynecology

## 2015-03-26 ENCOUNTER — Encounter: Payer: Self-pay | Admitting: Gynecology

## 2015-03-26 ENCOUNTER — Ambulatory Visit (INDEPENDENT_AMBULATORY_CARE_PROVIDER_SITE_OTHER): Payer: BLUE CROSS/BLUE SHIELD | Admitting: Gynecology

## 2015-03-26 VITALS — BP 128/80 | Ht 69.0 in | Wt 191.0 lb

## 2015-03-26 DIAGNOSIS — Z01419 Encounter for gynecological examination (general) (routine) without abnormal findings: Secondary | ICD-10-CM | POA: Diagnosis not present

## 2015-03-26 NOTE — Progress Notes (Signed)
Regina MoatsLinda E Collins 22-Jan-1961 [id number]   History:    54 y.o.  for annual gyn exam with no complaints today. Patient is no longer on hormone replacement therapy and she has very minimal vasomotor symptoms. She discontinued the HRT back in October 2014. She had her blood work done recently at her work and brought a copy with her and the following blood tests had been drawn and were in the normal range: CBC, comprehensive metabolic panel, fasting lipid profile, TSH, and urinalysis and vitamin D level.  Patient had a normal bone density study in 2012 and 2015 respectively.Patient in 2002 had LEEP cervical conization of her cervix pathology report indicated only CIN-1 and subsequent Pap smears have been normal. Last 3 years consecutively Pap smears been normal. Patient declined flu vaccine. Patient received the TDap vaccine in 2014. Patient had a colonoscopy normal January 2015.   Past medical history,surgical history, family history and social history were all reviewed and documented in the EPIC chart.  Gynecologic History No LMP recorded. Patient is not currently having periods (Reason: Other). Contraception: post menopausal status Last Pap: 2014. Results were: normal Last mammogram: 2014. Results were: normal  Obstetric History OB History  Gravida Para Term Preterm AB SAB TAB Ectopic Multiple Living  2 2        2     # Outcome Date GA Lbr Len/2nd Weight Sex Delivery Anes PTL Lv  2 Para     M CS-Unspec     1 Para     F Vag-Spont          ROS: A ROS was performed and pertinent positives and negatives are included in the history.  GENERAL: No fevers or chills. HEENT: No change in vision, no earache, sore throat or sinus congestion. NECK: No pain or stiffness. CARDIOVASCULAR: No chest pain or pressure. No palpitations. PULMONARY: No shortness of breath, cough or wheeze. GASTROINTESTINAL: No abdominal pain, nausea, vomiting or diarrhea, melena or bright red blood per rectum. GENITOURINARY: No  urinary frequency, urgency, hesitancy or dysuria. MUSCULOSKELETAL: No joint or muscle pain, no back pain, no recent trauma. DERMATOLOGIC: No rash, no itching, no lesions. ENDOCRINE: No polyuria, polydipsia, no heat or cold intolerance. No recent change in weight. HEMATOLOGICAL: No anemia or easy bruising or bleeding. NEUROLOGIC: No headache, seizures, numbness, tingling or weakness. PSYCHIATRIC: No depression, no loss of interest in normal activity or change in sleep pattern.     Exam: chaperone present  BP 128/80 mmHg  Ht 5\' 9"  (1.753 m)  Wt 191 lb (86.637 kg)  BMI 28.19 kg/m2  Body mass index is 28.19 kg/(m^2).  General appearance : Well developed well nourished female. No acute distress HEENT: Eyes: no retinal hemorrhage or exudates,  Neck supple, trachea midline, no carotid bruits, no thyroidmegaly Lungs: Clear to auscultation, no rhonchi or wheezes, or rib retractions  Heart: Regular rate and rhythm, no murmurs or gallops Breast:Examined in sitting and supine position were symmetrical in appearance, no palpable masses or tenderness,  no skin retraction, no nipple inversion, no nipple discharge, no skin discoloration, no axillary or supraclavicular lymphadenopathy Abdomen: no palpable masses or tenderness, no rebound or guarding Extremities: no edema or skin discoloration or tenderness  Pelvic:  Bartholin, Urethra, Skene Glands: Within normal limits             Vagina: No gross lesions or discharge  Cervix: No gross lesions or discharge  Uterus  anteverted, normal size, shape and consistency, non-tender and mobile  Adnexa  Without masses or tenderness  Anus and perineum  normal   Rectovaginal  normal sphincter tone without palpated masses or tenderness             Hemoccult cards provided     Assessment/Plan:  54 y.o. female for annual exam who is menopausal with minimal vasomotor symptoms on no hormone replacement therapy. Patient declined flu vaccine today. Pap smear not  indicated this year. Patient scheduling her mammogram later today. Discussed importance of monthly breast exams. We discussed importance of calcium vitamin D and regular exercise for osteoporosis prevention. Patient was provided with fecal Hemoccult cards dismissed to the office for testing.   Ok Edwards MD, 2:29 PM 03/26/2015

## 2015-03-29 ENCOUNTER — Encounter: Payer: Self-pay | Admitting: Gynecology

## 2015-05-11 ENCOUNTER — Telehealth: Payer: Self-pay | Admitting: *Deleted

## 2015-05-11 MED ORDER — ACYCLOVIR 400 MG PO TABS: 400.0000 mg | ORAL_TABLET | Freq: Every day | ORAL | Status: DC

## 2015-05-11 NOTE — Telephone Encounter (Signed)
Pt called requesting her acyclovir 400 mg tablet sent to new mail order pharmacy [facility].

## 2015-08-13 ENCOUNTER — Encounter: Payer: Self-pay | Admitting: Gastroenterology

## 2015-08-13 ENCOUNTER — Ambulatory Visit (INDEPENDENT_AMBULATORY_CARE_PROVIDER_SITE_OTHER): Payer: BLUE CROSS/BLUE SHIELD | Admitting: Gastroenterology

## 2015-08-13 VITALS — BP 124/72 | HR 88 | Ht 68.75 in | Wt 197.1 lb

## 2015-08-13 DIAGNOSIS — R197 Diarrhea, unspecified: Secondary | ICD-10-CM

## 2015-08-13 NOTE — Patient Instructions (Signed)
Follow up as needed.  Thank you for choosing [facility]  Dr Henry Danis III  

## 2015-08-13 NOTE — Progress Notes (Signed)
[name] Office visit Note:  History: Regina Collins 08/13/2015  Referring physician: Kristian CoveyBURCHETTE,BRUCE W, MD  Reason for consult/chief complaint: Abdominal Cramping and Diarrhea   Subjective HPI:  Regina Collins is seeing me for the first time, she previously had a normal screening colonoscopy with Dr. Juanda ChanceBrodie with the exception of sigmoid diverticulosis. She was feeling well until about 3 months ago, when she became acutely ill with both respiratory and GI illness after returning from a trip. Her daughter was sick with the same symptoms. She required 2 rounds of antibiotics and 2 rounds of prednisone for sinus followed by tooth infection. After that she had acute onset of lower abdominal cramps with urgency and frequent BMs. There was a single episode of some blood tinged mucus. Symptoms seem to improve for a week, and then came back after more antibiotics were given. She started taking probiotics a few weeks ago and is steadily improving since then. Stool is formed, she still has some postprandial feelings of urgency for a bowel movement and some mucus production.  ROS:  Review of Systems  Constitutional: Negative for appetite change and unexpected weight change.  HENT: Negative for mouth sores and voice change.   Eyes: Negative for pain and redness.  Respiratory: Negative for cough and shortness of breath.   Cardiovascular: Negative for chest pain and palpitations.  Genitourinary: Negative for dysuria and hematuria.  Musculoskeletal: Negative for myalgias and arthralgias.  Skin: Negative for pallor and rash.  Neurological: Negative for weakness and headaches.  Hematological: Negative for adenopathy.     Past Medical History: Past Medical History  Diagnosis Date  . CIN I (cervical intraepithelial neoplasia I) 2002  . NSVD (normal spontaneous vaginal delivery)     ONE  . HSV-2 (herpes simplex virus 2) infection   . Premature menopause   . Vitamin D deficiency       Past Surgical History: Past Surgical History  Procedure Laterality Date  . Cervical biopsy  w/ loop electrode excision  2002    LEEP CONE  . Tubal ligation      POMEROY  . Cesarean section  1998  . Appendectomy  1976  . Laparoscopy  1994     Family History: Family History  Problem Relation Age of Onset  . Cancer Mother     LUNG  . Diabetes Father   . Cancer Father     LIVER ,  . Alzheimer's disease Father   . Diabetes Maternal Grandfather   . Colon cancer Neg Hx   . Esophageal cancer Neg Hx   . Rectal cancer Neg Hx   . Stomach cancer Neg Hx     Social History: Social History   Social History  . Marital Status: Divorced    Spouse Name: N/A  . Number of Children: N/A  . Years of Education: N/A   Social History Main Topics  . Smoking status: Never Smoker   . Smokeless tobacco: Never Used  . Alcohol Use: 0.0 oz/week    0 Standard drinks or equivalent per week     Comment: 2 drinks a month  . Drug Use: No  . Sexual Activity: Not Currently    Birth Control/ Protection: Surgical     Comment: TUBAL LIGATION   Other Topics Concern  . None   Social History Narrative    Allergies: No Known Allergies  Outpatient Meds: Current Outpatient Prescriptions  Medication Sig Dispense Refill  . acyclovir (ZOVIRAX) 400 MG tablet Take 1 tablet (400 mg total) by  mouth daily. 90 tablet 3  . Calcium Carbonate-Vit D-Min (CALCIUM 1200 PO) Take by mouth.      . cholecalciferol (VITAMIN D) 1000 UNITS tablet Take 500 Units by mouth daily.    Marland Kitchen ibuprofen (ADVIL,MOTRIN) 800 MG tablet Take 1 tablet (800 mg total) by mouth every 8 (eight) hours as needed for pain. 30 tablet 1  . loratadine (CLARITIN) 10 MG tablet Take 10 mg by mouth daily.    . magnesium 30 MG tablet Take 30 mg by mouth 2 (two) times daily.      . Probiotic Product (PROBIOTIC PO) Take 1 tablet by mouth daily.     No current facility-administered medications for this visit.       ___________________________________________________________________ Objective  Exam:  BP 124/72 mmHg  Pulse 88  Ht 5' 8.75" (1.746 m)  Wt 197 lb 2 oz (89.415 kg)  BMI 29.33 kg/m2   General: this is a(n) Well-appearing woman with good muscle mass   Eyes: sclera anicteric, no redness  ENT: oral mucosa moist without lesions, no cervical or supraclavicular lymphadenopathy, good dentition  CV: RRR without murmur, S1/S2, no JVD, no peripheral edema  Resp: clear to auscultation bilaterally, normal RR and effort noted  GI: soft, no tenderness, with active bowel sounds. No guarding or palpable organomegaly noted.  Skin; warm and dry, no rash or jaundice noted  Neuro: awake, alert and oriented x 3. Normal gross motor function and fluent speech  Data No stool studies were done  Assessment: Encounter Diagnosis  Name Primary?  . Diarrhea, unspecified type Yes    Sounds like acute lower GI symptoms set off by either viral illness or more likely multiple rounds of broad-spectrum antibiotics. This probably caused dysbiosis.  This all slowly improving with time and perhaps probiotics.  Plan:  No further testing necessary at this point. He will continue probiotics for another couple of weeks, I think things will steadily improve and then normalize. She will call me in a month if she is not improved, certainly sooner if she has worsening of symptoms, especially bleeding.   Charlie Pitter III

## 2016-03-29 ENCOUNTER — Encounter: Payer: Self-pay | Admitting: Gynecology

## 2016-06-02 ENCOUNTER — Telehealth: Payer: Self-pay | Admitting: *Deleted

## 2016-06-02 MED ORDER — ACYCLOVIR 400 MG PO TABS: 400.0000 mg | ORAL_TABLET | Freq: Every day | ORAL | 3 refills | Status: AC

## 2016-06-02 NOTE — Telephone Encounter (Signed)
Rx sent, pt aware 

## 2016-06-02 NOTE — Telephone Encounter (Signed)
Pt has moved to [place] had not found a MD yet, but needs refill on acyclovir 400 mg tablet 1 po daily. Pt asked if you could refill 90 day supply this 1 time? Please advise

## 2016-06-02 NOTE — Telephone Encounter (Signed)
Yes with 3 refills

## 2016-09-06 ENCOUNTER — Encounter: Payer: Self-pay | Admitting: Gynecology

## 2017-01-11 ENCOUNTER — Encounter: Payer: Self-pay | Admitting: Family Medicine

## 2018-02-27 ENCOUNTER — Encounter: Payer: Self-pay | Admitting: Women's Health

## 2024-04-01 ENCOUNTER — Telehealth: Payer: Self-pay

## 2024-04-01 NOTE — Telephone Encounter (Signed)
 Called and spoke with patient and advise patient of recall for colonoscopy; patient reports she has transferred her care to a practice in [place] and had her colonoscopy in 2025 with that office;  Informational encounter;
